# Patient Record
Sex: Female | Born: 1940 | Race: White | Hispanic: No | Marital: Married | Smoking: Never smoker
Health system: Southern US, Community
[De-identification: ages and names within clinical notes are randomized; demographics above are authoritative.]

## PROBLEM LIST (undated history)

## (undated) DIAGNOSIS — E079 Disorder of thyroid, unspecified: Secondary | ICD-10-CM

## (undated) DIAGNOSIS — I10 Essential (primary) hypertension: Secondary | ICD-10-CM

## (undated) HISTORY — PX: BLADDER SURGERY: SHX569

## (undated) HISTORY — PX: INCONTINENCE SURGERY: SHX676

## (undated) HISTORY — PX: HERNIA REPAIR: SHX51

## (undated) HISTORY — PX: ABDOMINAL HYSTERECTOMY: SHX81

---

## 2001-06-05 ENCOUNTER — Encounter: Payer: Self-pay | Admitting: Family Medicine

## 2001-06-05 ENCOUNTER — Ambulatory Visit (HOSPITAL_COMMUNITY): Admission: RE | Admit: 2001-06-05 | Discharge: 2001-06-05 | Payer: Self-pay | Admitting: Family Medicine

## 2001-06-19 ENCOUNTER — Ambulatory Visit (HOSPITAL_COMMUNITY): Admission: RE | Admit: 2001-06-19 | Discharge: 2001-06-19 | Payer: Self-pay | Admitting: Family Medicine

## 2001-06-19 ENCOUNTER — Encounter: Payer: Self-pay | Admitting: Family Medicine

## 2001-07-01 ENCOUNTER — Emergency Department (HOSPITAL_COMMUNITY): Admission: EM | Admit: 2001-07-01 | Discharge: 2001-07-01 | Payer: Self-pay | Admitting: Emergency Medicine

## 2001-08-05 ENCOUNTER — Encounter: Payer: Self-pay | Admitting: Otolaryngology

## 2001-08-05 ENCOUNTER — Ambulatory Visit (HOSPITAL_COMMUNITY): Admission: RE | Admit: 2001-08-05 | Discharge: 2001-08-05 | Payer: Self-pay | Admitting: Otolaryngology

## 2002-01-28 ENCOUNTER — Ambulatory Visit (HOSPITAL_COMMUNITY): Admission: RE | Admit: 2002-01-28 | Discharge: 2002-01-28 | Payer: Self-pay | Admitting: Family Medicine

## 2002-01-28 ENCOUNTER — Encounter: Payer: Self-pay | Admitting: Family Medicine

## 2002-06-22 ENCOUNTER — Ambulatory Visit (HOSPITAL_COMMUNITY): Admission: RE | Admit: 2002-06-22 | Discharge: 2002-06-22 | Payer: Self-pay | Admitting: Family Medicine

## 2002-06-22 ENCOUNTER — Encounter: Payer: Self-pay | Admitting: Family Medicine

## 2002-09-01 ENCOUNTER — Emergency Department (HOSPITAL_COMMUNITY): Admission: EM | Admit: 2002-09-01 | Discharge: 2002-09-01 | Payer: Self-pay | Admitting: *Deleted

## 2003-01-17 ENCOUNTER — Other Ambulatory Visit: Admission: RE | Admit: 2003-01-17 | Discharge: 2003-01-17 | Payer: Self-pay | Admitting: Obstetrics & Gynecology

## 2003-03-04 ENCOUNTER — Ambulatory Visit (HOSPITAL_COMMUNITY): Admission: RE | Admit: 2003-03-04 | Discharge: 2003-03-04 | Payer: Self-pay | Admitting: Gastroenterology

## 2003-03-04 ENCOUNTER — Encounter: Payer: Self-pay | Admitting: Gastroenterology

## 2003-03-18 ENCOUNTER — Ambulatory Visit (HOSPITAL_COMMUNITY): Admission: RE | Admit: 2003-03-18 | Discharge: 2003-03-18 | Payer: Self-pay | Admitting: Gastroenterology

## 2003-05-20 ENCOUNTER — Ambulatory Visit (HOSPITAL_COMMUNITY): Admission: RE | Admit: 2003-05-20 | Discharge: 2003-05-20 | Payer: Self-pay | Admitting: Family Medicine

## 2004-08-17 ENCOUNTER — Other Ambulatory Visit: Admission: RE | Admit: 2004-08-17 | Discharge: 2004-08-17 | Payer: Self-pay | Admitting: Obstetrics & Gynecology

## 2004-10-25 ENCOUNTER — Other Ambulatory Visit: Admission: RE | Admit: 2004-10-25 | Discharge: 2004-10-25 | Payer: Self-pay | Admitting: Obstetrics & Gynecology

## 2005-04-26 ENCOUNTER — Other Ambulatory Visit: Admission: RE | Admit: 2005-04-26 | Discharge: 2005-04-26 | Payer: Self-pay | Admitting: Obstetrics & Gynecology

## 2006-04-29 ENCOUNTER — Ambulatory Visit (HOSPITAL_COMMUNITY): Admission: RE | Admit: 2006-04-29 | Discharge: 2006-04-29 | Payer: Self-pay | Admitting: Family Medicine

## 2007-07-27 ENCOUNTER — Emergency Department (HOSPITAL_COMMUNITY): Admission: EM | Admit: 2007-07-27 | Discharge: 2007-07-27 | Payer: Self-pay | Admitting: Emergency Medicine

## 2009-10-23 ENCOUNTER — Ambulatory Visit (HOSPITAL_COMMUNITY): Admission: RE | Admit: 2009-10-23 | Discharge: 2009-10-23 | Payer: Self-pay | Admitting: Family Medicine

## 2010-03-29 ENCOUNTER — Inpatient Hospital Stay (HOSPITAL_COMMUNITY)
Admission: EM | Admit: 2010-03-29 | Discharge: 2010-04-02 | Payer: Self-pay | Source: Home / Self Care | Admitting: Emergency Medicine

## 2010-07-01 ENCOUNTER — Ambulatory Visit (HOSPITAL_COMMUNITY): Admission: RE | Admit: 2010-07-01 | Payer: Self-pay | Admitting: Obstetrics and Gynecology

## 2010-09-05 LAB — COMPREHENSIVE METABOLIC PANEL
ALT: 106 U/L — ABNORMAL HIGH (ref 0–35)
ALT: 134 U/L — ABNORMAL HIGH (ref 0–35)
ALT: 83 U/L — ABNORMAL HIGH (ref 0–35)
ALT: 90 U/L — ABNORMAL HIGH (ref 0–35)
AST: 115 U/L — ABNORMAL HIGH (ref 0–37)
AST: 53 U/L — ABNORMAL HIGH (ref 0–37)
Albumin: 2.7 g/dL — ABNORMAL LOW (ref 3.5–5.2)
Albumin: 2.9 g/dL — ABNORMAL LOW (ref 3.5–5.2)
Albumin: 3.4 g/dL — ABNORMAL LOW (ref 3.5–5.2)
Alkaline Phosphatase: 104 U/L (ref 39–117)
Alkaline Phosphatase: 74 U/L (ref 39–117)
Alkaline Phosphatase: 87 U/L (ref 39–117)
BUN: 10 mg/dL (ref 6–23)
BUN: 7 mg/dL (ref 6–23)
CO2: 20 mEq/L (ref 19–32)
Calcium: 8.6 mg/dL (ref 8.4–10.5)
Chloride: 103 mEq/L (ref 96–112)
Chloride: 92 mEq/L — ABNORMAL LOW (ref 96–112)
GFR calc Af Amer: >60 mL/min (ref >60)
GFR calc non Af Amer: >60 mL/min (ref >60)
Glucose, Bld: 109 mg/dL — ABNORMAL HIGH (ref 70–99)
Glucose, Bld: 118 mg/dL — ABNORMAL HIGH (ref 70–99)
Glucose, Bld: 122 mg/dL — ABNORMAL HIGH (ref 70–99)
Potassium: 2.5 mEq/L — CL (ref 3.5–5.1)
Potassium: 3.3 mEq/L — ABNORMAL LOW (ref 3.5–5.1)
Potassium: 3.4 mEq/L — ABNORMAL LOW (ref 3.5–5.1)
Potassium: 3.7 mEq/L (ref 3.5–5.1)
Sodium: 132 mEq/L — ABNORMAL LOW (ref 135–145)
Sodium: 132 mEq/L — ABNORMAL LOW (ref 135–145)
Sodium: 135 mEq/L (ref 135–145)
Total Bilirubin: 0.4 mg/dL (ref 0.3–1.2)
Total Protein: 6 g/dL (ref 6.0–8.3)
Total Protein: 6.4 g/dL (ref 6.0–8.3)
Total Protein: 7.3 g/dL (ref 6.0–8.3)

## 2010-09-05 LAB — CBC
HCT: 31.7 % — ABNORMAL LOW (ref 36.0–46.0)
HCT: 33 % — ABNORMAL LOW (ref 36.0–46.0)
HCT: 36.3 % (ref 36.0–46.0)
HCT: 39.1 % (ref 36.0–46.0)
Hemoglobin: 10.9 g/dL — ABNORMAL LOW (ref 12.0–15.0)
MCV: 76.5 fL — ABNORMAL LOW (ref 78.0–100.0)
MCV: 76.7 fL — ABNORMAL LOW (ref 78.0–100.0)
Platelets: 213 10*3/uL (ref 150–400)
Platelets: ADEQUATE 10*3/uL (ref 150–400)
RBC: 4.15 MIL/uL (ref 3.87–5.11)
RBC: 5.1 MIL/uL (ref 3.87–5.11)
RDW: 16.3 % — ABNORMAL HIGH (ref 11.5–15.5)
RDW: 16.7 % — ABNORMAL HIGH (ref 11.5–15.5)
RDW: 16.9 % — ABNORMAL HIGH (ref 11.5–15.5)
RDW: 17 % — ABNORMAL HIGH (ref 11.5–15.5)
WBC: 4.4 10*3/uL (ref 4.0–10.5)
WBC: 6 10*3/uL (ref 4.0–10.5)
WBC: 6.3 10*3/uL (ref 4.0–10.5)
WBC: 8.2 10*3/uL (ref 4.0–10.5)

## 2010-09-05 LAB — DIFFERENTIAL
Basophils Absolute: 0 10*3/uL (ref 0.0–0.1)
Basophils Relative: 0 % (ref 0–1)
Basophils Relative: 0 % (ref 0–1)
Basophils Relative: 0 % (ref 0–1)
Eosinophils Absolute: 0 10*3/uL (ref 0.0–0.7)
Eosinophils Absolute: 0 10*3/uL (ref 0.0–0.7)
Eosinophils Absolute: 0 10*3/uL (ref 0.0–0.7)
Eosinophils Absolute: 0.1 10*3/uL (ref 0.0–0.7)
Eosinophils Relative: 0 % (ref 0–5)
Lymphocytes Relative: 4 % — ABNORMAL LOW (ref 12–46)
Lymphs Abs: 0.2 10*3/uL — ABNORMAL LOW (ref 0.7–4.0)
Lymphs Abs: 0.3 10*3/uL — ABNORMAL LOW (ref 0.7–4.0)
Lymphs Abs: 0.5 10*3/uL — ABNORMAL LOW (ref 0.7–4.0)
Monocytes Absolute: 0.1 10*3/uL (ref 0.1–1.0)
Monocytes Absolute: 0.2 10*3/uL (ref 0.1–1.0)
Monocytes Absolute: 0.2 10*3/uL (ref 0.1–1.0)
Monocytes Absolute: 0.2 10*3/uL (ref 0.1–1.0)
Monocytes Relative: 2 % — ABNORMAL LOW (ref 3–12)
Monocytes Relative: 3 % (ref 3–12)
Monocytes Relative: 5 % (ref 3–12)
Neutrophils Relative %: 87 % — ABNORMAL HIGH (ref 43–77)

## 2010-09-05 LAB — URINALYSIS, ROUTINE W REFLEX MICROSCOPIC
Glucose, UA: NEGATIVE mg/dL
Hgb urine dipstick: NEGATIVE
Specific Gravity, Urine: 1.015 (ref 1.005–1.030)
pH: 7 (ref 5.0–8.0)

## 2010-09-05 LAB — GLUCOSE, CAPILLARY
Glucose-Capillary: 120 mg/dL — ABNORMAL HIGH (ref 70–99)
Glucose-Capillary: 83 mg/dL (ref 70–99)
Glucose-Capillary: 88 mg/dL (ref 70–99)
Glucose-Capillary: 97 mg/dL (ref 70–99)

## 2010-09-05 LAB — HEPATITIS PANEL, ACUTE
Hep A IgM: NEGATIVE
Hep B C IgM: NEGATIVE
Hepatitis B Surface Ag: NEGATIVE

## 2010-09-05 LAB — CULTURE, BLOOD (ROUTINE X 2): Culture: NO GROWTH

## 2010-09-05 LAB — BASIC METABOLIC PANEL
Chloride: 101 mEq/L (ref 96–112)
Creatinine, Ser: 0.67 mg/dL (ref 0.4–1.2)
GFR calc Af Amer: >60 mL/min (ref >60)
Potassium: 3.4 mEq/L — ABNORMAL LOW (ref 3.5–5.1)
Sodium: 133 mEq/L — ABNORMAL LOW (ref 135–145)

## 2010-09-05 LAB — STOOL CULTURE

## 2010-09-05 LAB — POCT CARDIAC MARKERS: Troponin i, poc: <0.05 ng/mL (ref 0.00–0.09)

## 2010-09-05 LAB — URINE CULTURE: Culture  Setup Time: 201110070348

## 2010-09-05 LAB — LACTIC ACID, PLASMA: Lactic Acid, Venous: 1 mmol/L (ref 0.5–2.2)

## 2010-11-21 ENCOUNTER — Other Ambulatory Visit (HOSPITAL_COMMUNITY): Payer: Self-pay | Admitting: Family Medicine

## 2010-11-21 DIAGNOSIS — Z139 Encounter for screening, unspecified: Secondary | ICD-10-CM

## 2010-11-27 ENCOUNTER — Ambulatory Visit (HOSPITAL_COMMUNITY): Payer: Medicare Other

## 2011-03-15 LAB — CBC
Hemoglobin: 14.9
MCHC: 34.3
MCV: 84.1
RBC: 5.16 — ABNORMAL HIGH
RDW: 13.5

## 2011-03-15 LAB — DIFFERENTIAL
Basophils Absolute: 0
Basophils Relative: 0
Eosinophils Absolute: 0
Monocytes Absolute: 0.4
Monocytes Relative: 3
Neutro Abs: 12.5 — ABNORMAL HIGH

## 2011-03-15 LAB — BASIC METABOLIC PANEL
CO2: 27
Calcium: 9.6
Chloride: 100
Creatinine, Ser: 0.84
Glucose, Bld: 153 — ABNORMAL HIGH
Sodium: 138

## 2011-03-15 LAB — HEPATIC FUNCTION PANEL
AST: 28
Bilirubin, Direct: 0.2
Indirect Bilirubin: 0.5
Total Bilirubin: 0.7

## 2011-03-15 LAB — LIPASE, BLOOD: Lipase: 29

## 2012-01-31 ENCOUNTER — Emergency Department (HOSPITAL_COMMUNITY)
Admission: EM | Admit: 2012-01-31 | Discharge: 2012-01-31 | Disposition: A | Discharge status: Home / Self Care | Payer: Medicare Other | Attending: Emergency Medicine | Admitting: Emergency Medicine

## 2012-01-31 ENCOUNTER — Emergency Department (HOSPITAL_COMMUNITY): Payer: Medicare Other

## 2012-01-31 ENCOUNTER — Encounter (HOSPITAL_COMMUNITY): Payer: Self-pay | Admitting: *Deleted

## 2012-01-31 DIAGNOSIS — R079 Chest pain, unspecified: Secondary | ICD-10-CM

## 2012-01-31 HISTORY — DX: Essential (primary) hypertension: I10

## 2012-01-31 LAB — CBC WITH DIFFERENTIAL/PLATELET
Basophils Absolute: 0 10*3/uL (ref 0.0–0.1)
Eosinophils Relative: 2 % (ref 0–5)
Lymphocytes Relative: 25 % (ref 12–46)
Lymphs Abs: 2.2 10*3/uL (ref 0.7–4.0)
MCV: 86.1 fL (ref 78.0–100.0)
Neutro Abs: 5.6 10*3/uL (ref 1.7–7.7)
Neutrophils Relative %: 63 % (ref 43–77)
Platelets: 327 10*3/uL (ref 150–400)
RBC: 4.61 MIL/uL (ref 3.87–5.11)
RDW: 16.3 % — ABNORMAL HIGH (ref 11.5–15.5)
WBC: 8.9 10*3/uL (ref 4.0–10.5)

## 2012-01-31 LAB — BASIC METABOLIC PANEL
CO2: 25 mEq/L (ref 19–32)
Calcium: 10.1 mg/dL (ref 8.4–10.5)
Chloride: 97 mEq/L (ref 96–112)
Glucose, Bld: 97 mg/dL (ref 70–99)
Potassium: 4.1 mEq/L (ref 3.5–5.1)
Sodium: 133 mEq/L — ABNORMAL LOW (ref 135–145)

## 2012-01-31 MED ORDER — NITROGLYCERIN 0.4 MG SL SUBL: 0.4000 mg | SUBLINGUAL_TABLET | SUBLINGUAL | Status: AC | PRN

## 2012-01-31 MED ORDER — ASPIRIN 81 MG PO CHEW
324.0000 mg | CHEWABLE_TABLET | Freq: Once | ORAL | Status: AC
  Administered 2012-01-31: 324 mg via ORAL
  Filled 2012-01-31: qty 4

## 2012-01-31 NOTE — ED Provider Notes (Signed)
History   This chart was scribed for Jennifer Hutching, MD by Jennifer Sosa . The patient was seen in room APA07/APA07. Patient's care was started at 0930.    CSN: [id number]  Arrival date & time 01/31/12  0917   First MD Initiated Contact with Patient 01/31/12 0930      Chief Complaint  Patient presents with  . Chest Pain    (Consider location/radiation/quality/duration/timing/severity/associated sxs/prior treatment) HPI Jennifer Sosa is a 71 y.o. female who presents to the Emergency Department complaining of intermittent, moderate, centralized chest pain for the past week. Pt describes her chest pain as tightness. Pt states that she has had x3 episodes of chest tightness this week, with the worse being yesterday that lasted for 10-15 minutes. Pt states that yesterday's episode occurred after minimal exertion and the chest pain radiated to bilateral chest, bilateral arms and to her right jaw. Pt denies any radiation into her back. Pt also reports associated dizziness. Pt denies any SOB, diaphoresis and nausea. Pt states that she is not having any chest pain currently, but states that she feels weak. Pt reports a h/o HTN and diabetes. Pt denies smoking. Pt denies any h/o heart problems. Pt states that she went to Dr. Michelle Sosa office this morning and was told to come to ED for evaluation.     Past Medical History  Diagnosis Date  . Diabetes mellitus   . Hypertension     Past Surgical History  Procedure Date  . Abdominal hysterectomy   . Bladder surgery   . Incontinence surgery     History reviewed. No pertinent family history.  History  Substance Use Topics  . Smoking status: Never Smoker   . Smokeless tobacco: Not on file  . Alcohol Use: No    OB History    Grav Para Term Preterm Abortions TAB SAB Ect Mult Living                  Review of Systems A complete 10 system review of systems was obtained and all systems are negative except as noted in the HPI and PMH.    Allergies  Review of patient's allergies indicates no known allergies.  Home Medications   Current Outpatient Rx  Name Route Sig Dispense Refill  . LISINOPRIL-HYDROCHLOROTHIAZIDE 20-25 MG PO TABS Oral Take 1 tablet by mouth every morning.    Marland Kitchen METFORMIN HCL ER 500 MG PO TB24 Oral Take 500 mg by mouth 2 (two) times daily.     . ADULT MULTIVITAMIN W/MINERALS CH Oral Take 1 tablet by mouth every morning.    Marland Kitchen OMEPRAZOLE 20 MG PO CPDR Oral Take 20 mg by mouth daily as needed. For acid reflux    . POLYETHYL GLYCOL-PROPYL GLYCOL 0.4-0.3 % OP GEL Both Eyes Place 1 drop into both eyes 2 (two) times daily as needed. For dry eyes    . TRIAMTERENE-HCTZ 37.5-25 MG PO CAPS Oral Take 1 capsule by mouth daily as needed. For high blood pressure    . VERAPAMIL HCL ER 240 MG PO TBCR Oral Take 240 mg by mouth at bedtime.      BP 144/57  Temp 98.1 F (36.7 C) (Oral)  Resp 16  Ht 4\' 11"  (1.499 m)  Wt 115 lb (52.164 kg)  BMI 23.23 kg/m2  SpO2 100%  Physical Exam  Nursing note and vitals reviewed. Constitutional: She is oriented to person, place, and time. She appears well-developed and well-nourished. No distress.  HENT:  Head: Normocephalic and  atraumatic.  Eyes: EOM are normal. Pupils are equal, round, and reactive to light.  Neck: Normal range of motion. Neck supple. No tracheal deviation present.  Cardiovascular: Normal rate, regular rhythm and normal heart sounds.   Pulmonary/Chest: Effort normal and breath sounds normal. No respiratory distress. She has no wheezes.  Abdominal: Soft. She exhibits no distension.  Musculoskeletal: Normal range of motion. She exhibits no edema.  Neurological: She is alert and oriented to person, place, and time. No sensory deficit.  Skin: Skin is warm and dry.  Psychiatric: She has a normal mood and affect. Her behavior is normal.    ED Course  Procedures (including critical care time)  DIAGNOSTIC STUDIES: Oxygen Saturation is 100% on room air, normal by  my interpretation.    COORDINATION OF CARE:  09:39-Discussed planned course of treatment with the patient including checking cardiac enzymes, blood work, EKG and chest x-ray who is agreeable at this time.   10:00-Medication Orders: Aspirin chewable tablet 324 mg-once.   10:51-Consultation with Dr. Sudie Sosa. Discussed pt's case. Pt will be started on aspirin, d/c and will f/u in Dr. Michelle Sosa office in 4 days.   11:04-Recheck: Informed pt of consult with Dr. Sudie Sosa and plan. Pt is agreeable.   Results for orders placed during the hospital encounter of 01/31/12  CBC WITH DIFFERENTIAL      Component Value Range   WBC 8.9  4.0 - 10.5 K/uL   RBC 4.61  3.87 - 5.11 MIL/uL   Hemoglobin 13.2  12.0 - 15.0 g/dL   HCT 16.1  09.6 - 04.5 %   MCV 86.1  78.0 - 100.0 fL   MCH 28.6  26.0 - 34.0 pg   MCHC 33.2  30.0 - 36.0 g/dL   RDW 40.9 (*) 81.1 - 91.4 %   Platelets 327  150 - 400 K/uL   Neutrophils Relative 63  43 - 77 %   Neutro Abs 5.6  1.7 - 7.7 K/uL   Lymphocytes Relative 25  12 - 46 %   Lymphs Abs 2.2  0.7 - 4.0 K/uL   Monocytes Relative 10  3 - 12 %   Monocytes Absolute 0.9  0.1 - 1.0 K/uL   Eosinophils Relative 2  0 - 5 %   Eosinophils Absolute 0.2  0.0 - 0.7 K/uL   Basophils Relative 1  0 - 1 %   Basophils Absolute 0.0  0.0 - 0.1 K/uL  BASIC METABOLIC PANEL      Component Value Range   Sodium 133 (*) 135 - 145 mEq/L   Potassium 4.1  3.5 - 5.1 mEq/L   Chloride 97  96 - 112 mEq/L   CO2 25  19 - 32 mEq/L   Glucose, Bld 97  70 - 99 mg/dL   BUN 11  6 - 23 mg/dL   Creatinine, Ser 7.82  0.50 - 1.10 mg/dL   Calcium 95.6  8.4 - 21.3 mg/dL   GFR calc non Af Amer 69 (*) >90 mL/min   GFR calc Af Amer 80 (*) >90 mL/min  TROPONIN I      Component Value Range   Troponin I <0.30  <0.30 ng/mL    Dg Chest 2 View  01/31/2012  *RADIOLOGY REPORT*  Clinical Data: Chest pain.  Chest tightness for 2 weeks.  CHEST - 2 VIEW  Comparison: 04/01/2010.  Findings:  Cardiopericardial silhouette within  normal limits. Mediastinal contours normal. Trachea midline.  No airspace disease or effusion.  Moderate hiatal hernia noted. Monitoring leads  are projected over the chest.  IMPRESSION: No active cardiopulmonary disease.  Original Report Authenticated By: Andreas Newport, M.D.     No diagnosis found.   Date: 01/31/2012  Rate: 71  Rhythm: normal sinus rhythm  QRS Axis: normal  Intervals: normal  ST/T Wave abnormalities: normal  Conduction Disutrbances: none  Narrative Interpretation: unremarkable      MDM  Patient's clinically and hemodynamically stable. No pain at present.  Discussed with Dr. Sudie Sosa.  He will see patient as an outpatient. Prescription for nitroglycerin given. Take aspirin daily. Return if worse.      I personally performed the services described in this documentation, which was scribed in my presence. The recorded information has been reviewed and considered.    Jennifer Hutching, MD 01/31/12 1157

## 2012-01-31 NOTE — ED Notes (Signed)
Pt c/o chest tightness in the center of her chest off and on x 1 week. Pt states that the worst pain was yesterday and that it radiated to both sides of her chest, bilateral arms and up to her right jaw. Pt also c/o dizziness. Pt states that she just feels weak today but has had no pain. Pt went to Dr. Michelle Nasuti office and was told to come to ED for evaluation.

## 2012-01-31 NOTE — ED Notes (Signed)
Pt still showing NSR on cardiac monitor. No c/o pain or tightness at this time. Family at bedside.

## 2012-11-27 ENCOUNTER — Telehealth: Payer: Self-pay

## 2012-11-27 NOTE — Telephone Encounter (Signed)
Pt was referred by Dr. Knowlton for screening colonoscopy. LMOM for a return call.  

## 2012-12-01 NOTE — Telephone Encounter (Signed)
    Ferne Reus      Sent: Mon November 30, 2012  8:33 AM    To: Trudee Kuster, LPN     Due: Mon November 30, 2012  8:36 AM       KEALOHILANI MAIORINO    MRN: [id number] DOB: 11/20/1940     Pt Work: [phone] Pt Home: [phone]           Message    Pt called to let you know that she wants to wait before scheduling tcs due to her being sick. She will call back to get scheduled.

## 2012-12-18 ENCOUNTER — Telehealth: Payer: Self-pay | Admitting: Cardiovascular Disease

## 2012-12-18 NOTE — Telephone Encounter (Signed)
Pt took bp this am  116/62 and pulse was 69  Did not take any BP med.  Blood sugar was ok and only took 1/2 of her metformin.  Now has swelling in feet and ankles ( is seen in [place])

## 2012-12-18 NOTE — Telephone Encounter (Signed)
Returned call.  Pt stated she got up this morning and her BP was 116/66 HR 69.  Stated a couple of hours afterwards she started having fluid around her ankles.  Stated she took a fluid pill he (Dr. Alanda Amass) had her on and it has gotten better.  Stated she takes her Calan at night.  Pt wanted to know what else she needed to do if anything.  Pt informed BP and HR are WNL and advised she should have taken her meds as directed, especially since the lisinopril has HCTZ in it which would have helped w/ fluid in ankles.  Pt advised to take all meds as directed and to hold BP meds only if BP is 100/60 or less and she should call the office.  Pt verbalized understanding and agreed w/ plan.

## 2013-01-15 ENCOUNTER — Other Ambulatory Visit: Payer: Self-pay | Admitting: Cardiovascular Disease

## 2013-01-15 LAB — COMPREHENSIVE METABOLIC PANEL
Albumin: 4 g/dL (ref 3.5–5.2)
Alkaline Phosphatase: 73 U/L (ref 39–117)
BUN: 14 mg/dL (ref 6–23)
Glucose, Bld: 94 mg/dL (ref 70–99)
Potassium: 4.3 mEq/L (ref 3.5–5.3)

## 2013-01-15 LAB — LIPID PANEL
Cholesterol: 161 mg/dL (ref 0–200)
HDL: 40 mg/dL (ref >39)
LDL Cholesterol: 91 mg/dL (ref 0–99)
Triglycerides: 151 mg/dL — ABNORMAL HIGH (ref <150)

## 2013-01-15 LAB — CBC WITH DIFFERENTIAL/PLATELET
Basophils Relative: 1 % (ref 0–1)
Eosinophils Absolute: 0.3 10*3/uL (ref 0.0–0.7)
HCT: 36.4 % (ref 36.0–46.0)
Hemoglobin: 11.7 g/dL — ABNORMAL LOW (ref 12.0–15.0)
MCH: 25.8 pg — ABNORMAL LOW (ref 26.0–34.0)
MCHC: 32.1 g/dL (ref 30.0–36.0)
Monocytes Absolute: 0.9 10*3/uL (ref 0.1–1.0)
Monocytes Relative: 12 % (ref 3–12)
Neutro Abs: 4.3 10*3/uL (ref 1.7–7.7)

## 2013-01-21 ENCOUNTER — Encounter: Payer: Self-pay | Admitting: Cardiovascular Disease

## 2013-04-06 ENCOUNTER — Ambulatory Visit: Payer: Medicare Other | Admitting: Cardiovascular Disease

## 2013-05-03 ENCOUNTER — Other Ambulatory Visit: Payer: Self-pay | Admitting: *Deleted

## 2013-05-03 MED ORDER — PANTOPRAZOLE SODIUM 40 MG PO TBEC: 40.0000 mg | DELAYED_RELEASE_TABLET | Freq: Every day | ORAL | Status: DC

## 2013-05-03 NOTE — Telephone Encounter (Signed)
pantoprazole 40mg  refilled electronically.  Changed form protonix  01/15/13.

## 2013-05-05 ENCOUNTER — Other Ambulatory Visit: Payer: Self-pay | Admitting: *Deleted

## 2013-05-05 MED ORDER — PANTOPRAZOLE SODIUM 40 MG PO TBEC: 40.0000 mg | DELAYED_RELEASE_TABLET | Freq: Every day | ORAL | Status: DC

## 2013-08-20 ENCOUNTER — Telehealth: Payer: Self-pay | Admitting: *Deleted

## 2013-08-20 MED ORDER — LISINOPRIL-HYDROCHLOROTHIAZIDE 20-25 MG PO TABS: 1.0000 | ORAL_TABLET | Freq: Every morning | ORAL | Status: AC

## 2013-08-20 NOTE — Telephone Encounter (Signed)
Faxed rx from [facility] f [phone] p [phone]  Lisinopril 20-25 mg tab #90

## 2013-08-20 NOTE — Telephone Encounter (Signed)
Medication sent via escribe.  

## 2013-08-30 ENCOUNTER — Telehealth: Payer: Self-pay | Admitting: *Deleted

## 2013-08-31 NOTE — Telephone Encounter (Signed)
error 

## 2013-09-03 ENCOUNTER — Ambulatory Visit: Payer: Medicare Other | Admitting: Cardiovascular Disease

## 2013-12-08 ENCOUNTER — Other Ambulatory Visit: Payer: Self-pay | Admitting: *Deleted

## 2013-12-08 MED ORDER — PANTOPRAZOLE SODIUM 40 MG PO TBEC: 40.0000 mg | DELAYED_RELEASE_TABLET | Freq: Every day | ORAL | Status: AC

## 2013-12-08 NOTE — Telephone Encounter (Signed)
pantoprazole 40mg  refilled w/no refills - needs appt.

## 2013-12-20 ENCOUNTER — Emergency Department (HOSPITAL_COMMUNITY): Payer: Medicare Other

## 2013-12-20 ENCOUNTER — Emergency Department (HOSPITAL_COMMUNITY)
Admission: EM | Admit: 2013-12-20 | Discharge: 2013-12-20 | Disposition: A | Discharge status: Home / Self Care | Payer: Medicare Other | Attending: Emergency Medicine | Admitting: Emergency Medicine

## 2013-12-20 ENCOUNTER — Encounter (HOSPITAL_COMMUNITY): Payer: Self-pay | Admitting: Emergency Medicine

## 2013-12-20 DIAGNOSIS — Y9389 Activity, other specified: Secondary | ICD-10-CM | POA: Insufficient documentation

## 2013-12-20 DIAGNOSIS — Z79899 Other long term (current) drug therapy: Secondary | ICD-10-CM | POA: Insufficient documentation

## 2013-12-20 DIAGNOSIS — E119 Type 2 diabetes mellitus without complications: Secondary | ICD-10-CM | POA: Insufficient documentation

## 2013-12-20 DIAGNOSIS — W010XXA Fall on same level from slipping, tripping and stumbling without subsequent striking against object, initial encounter: Secondary | ICD-10-CM | POA: Insufficient documentation

## 2013-12-20 DIAGNOSIS — S42292A Other displaced fracture of upper end of left humerus, initial encounter for closed fracture: Secondary | ICD-10-CM

## 2013-12-20 DIAGNOSIS — Y929 Unspecified place or not applicable: Secondary | ICD-10-CM | POA: Insufficient documentation

## 2013-12-20 DIAGNOSIS — E079 Disorder of thyroid, unspecified: Secondary | ICD-10-CM | POA: Insufficient documentation

## 2013-12-20 DIAGNOSIS — S42293A Other displaced fracture of upper end of unspecified humerus, initial encounter for closed fracture: Secondary | ICD-10-CM | POA: Insufficient documentation

## 2013-12-20 DIAGNOSIS — I10 Essential (primary) hypertension: Secondary | ICD-10-CM | POA: Insufficient documentation

## 2013-12-20 HISTORY — DX: Disorder of thyroid, unspecified: E07.9

## 2013-12-20 MED ORDER — OXYCODONE HCL 5 MG PO TABS: 5.0000 mg | ORAL_TABLET | Freq: Four times a day (QID) | ORAL | Status: AC | PRN

## 2013-12-20 MED ORDER — OXYCODONE HCL 5 MG PO TABS
5.0000 mg | ORAL_TABLET | Freq: Once | ORAL | Status: AC
  Administered 2013-12-20: 5 mg via ORAL
  Filled 2013-12-20: qty 1

## 2013-12-20 NOTE — ED Notes (Signed)
Pain lt shoulder after fall, slipped on piece of plastic

## 2013-12-20 NOTE — ED Provider Notes (Signed)
CSN: [id number]     Arrival date & time 12/20/13  1127 History   First MD Initiated Contact with Patient 12/20/13 1329 This chart was scribed for non-physician practitioner Kathie Dike, PA-C working with Vanetta Mulders, MD by Valera Castle, ED scribe. This patient was seen in room APFT23/APFT23 and the patient's care was started at 2:15 PM.     Chief Complaint  Patient presents with  . Fall    (Consider location/radiation/quality/duration/timing/severity/associated sxs/prior Treatment) Patient is a 73 y.o. female presenting with fall and shoulder injury. The history is provided by the patient. No language interpreter was used.  Fall This is a new problem. The current episode started 3 to 5 hours ago. The problem occurs constantly. The problem has been gradually worsening. Pertinent negatives include no chest pain, no abdominal pain, no headaches and no shortness of breath. Associated symptoms comments: + for left shoulder pain. Exacerbated by: movement. Nothing relieves the symptoms. She has tried nothing for the symptoms.  Shoulder Injury This is a new problem. The current episode started 3 to 5 hours ago. The problem occurs constantly. The problem has been gradually worsening. Pertinent negatives include no chest pain, no abdominal pain, no headaches and no shortness of breath. Exacerbated by: movement. Nothing relieves the symptoms.   HPI Comments: Jennifer Sosa is a 73 y.o. female who presents to the Emergency Department complaining of constant, left shoulder pain, onset shortly PTA after she slipped on a piece of plastic and landed on her shoulder trying to avoid landing on her hip. She states she felt her bone crack upon impact. She reports associated gradually worsening muscle pain around the area of impact. She denies open wounds, head trauma, abdominal pain, back pain, neck pain, and any other associated symptoms. She denies taking Coumadin.   PCP - Milana Obey, MD  Past  Medical History  Diagnosis Date  . Diabetes mellitus   . Hypertension   . Thyroid disease    Past Surgical History  Procedure Laterality Date  . Abdominal hysterectomy    . Bladder surgery    . Incontinence surgery     History reviewed. No pertinent family history. History  Substance Use Topics  . Smoking status: Never Smoker   . Smokeless tobacco: Not on file  . Alcohol Use: No   OB History   Grav Para Term Preterm Abortions TAB SAB Ect Mult Living                 Review of Systems  Constitutional: Negative for activity change.       All ROS Neg except as noted in HPI  HENT: Negative for nosebleeds.   Eyes: Negative for photophobia and discharge.  Respiratory: Negative for cough, shortness of breath and wheezing.   Cardiovascular: Negative for chest pain and palpitations.  Gastrointestinal: Negative for abdominal pain and blood in stool.  Genitourinary: Negative for dysuria, frequency and hematuria.  Musculoskeletal: Positive for arthralgias (left shoulder), joint swelling and myalgias (around left shoulder). Negative for back pain, neck pain and neck stiffness.  Skin: Negative.  Negative for wound.  Neurological: Negative for dizziness, seizures, syncope, speech difficulty and headaches.  Psychiatric/Behavioral: Negative for hallucinations and confusion.  All other systems reviewed and are negative.   Allergies  Review of patient's allergies indicates no known allergies.  Home Medications   Prior to Admission medications   Medication Sig Start Date End Date Taking? Authorizing Kavin Weckwerth  acetaminophen (TYLENOL) 500 MG tablet Take 1,500 mg by mouth  every 6 (six) hours as needed for moderate pain.   Yes Historical Amber Guthridge, MD  levothyroxine (SYNTHROID, LEVOTHROID) 25 MCG tablet Take 25 mcg by mouth daily before breakfast.   Yes Historical Cheryln Balcom, MD  lisinopril-hydrochlorothiazide (PRINZIDE,ZESTORETIC) 20-25 MG per tablet Take 1 tablet by mouth every morning.  08/20/13  Yes Laqueta LindenSuresh A Koneswaran, MD  metFORMIN (GLUCOPHAGE-XR) 500 MG 24 hr tablet Take 500 mg by mouth 2 (two) times daily as needed (high blood sugars).    Yes Historical Dvora Buitron, MD  pantoprazole (PROTONIX) 40 MG tablet Take 1 tablet (40 mg total) by mouth daily. 12/08/13  Yes Runell GessJonathan J Berry, MD  Polyethyl Glycol-Propyl Glycol (SYSTANE) 0.4-0.3 % GEL Place 1 drop into both eyes 2 (two) times daily as needed. For dry eyes   Yes Historical Estefanie Cornforth, MD  verapamil (CALAN-SR) 240 MG CR tablet Take 240 mg by mouth at bedtime.   Yes Historical Itai Barbian, MD  vitamin B-12 (CYANOCOBALAMIN) 1000 MCG tablet Take 1,000 mcg by mouth daily.   Yes Historical Maxx Pham, MD  nitroGLYCERIN (NITROSTAT) 0.4 MG SL tablet Place 1 tablet (0.4 mg total) under the tongue every 5 (five) minutes as needed for chest pain. 01/31/12 01/30/13  Donnetta HutchingBrian Cook, MD   BP 147/69  Pulse 80  Temp(Src) 98.3 F (36.8 C) (Oral)  Resp 18  Ht 5' (1.524 m)  Wt 115 lb (52.164 kg)  BMI 22.46 kg/m2  SpO2 100% Physical Exam  Nursing note and vitals reviewed. Constitutional: She is oriented to person, place, and time. She appears well-developed and well-nourished. No distress.  HENT:  Head: Normocephalic and atraumatic.  Eyes: Conjunctivae and EOM are normal.  Neck: Neck supple. No tracheal deviation present.  Cardiovascular: Normal rate, regular rhythm, normal heart sounds and intact distal pulses.   No murmur heard. Cap refill < 2 seconds.  Pulmonary/Chest: Effort normal and breath sounds normal. No respiratory distress. She exhibits no tenderness.  Abdominal: There is no tenderness.  Musculoskeletal: She exhibits tenderness.       Left shoulder: She exhibits decreased range of motion, tenderness, bony tenderness and deformity. She exhibits no laceration and normal pulse.       Left elbow: Normal. She exhibits no deformity.       Left wrist: Normal. She exhibits no deformity.  Deformity of left shoulder. No deformity of the  clavicle. No displacement of scapula. No effusion or deformity of elbow. No deformity of forearm or wrist. Degenerative changes of fingers.   Neurological: She is alert and oriented to person, place, and time.  Skin: Skin is warm and dry.  Psychiatric: She has a normal mood and affect. Her behavior is normal.    ED Course  Procedures (including critical care time)  DIAGNOSTIC STUDIES: Oxygen Saturation is 100% on room air, normal by my interpretation.    COORDINATION OF CARE: 2:22 PM-Discussed treatment plan with pt at bedside and pt agreed to plan.   Medications  oxyCODONE (Oxy IR/ROXICODONE) immediate release tablet 5 mg (not administered)   Dg Shoulder Left  12/20/2013   CLINICAL DATA:  Left shoulder pain status post fall  EXAM: LEFT SHOULDER - 2+ VIEW  COMPARISON:  None.  FINDINGS: There is a mildly impacted fracture of the neck of the left humerus. There is also an acute avulsion of the greater tuberosity of the left humerus. The bony glenoid is intact. The acromion and distal clavicle also are normal in appearance. The observed portions of the upper left ribs are normal.  IMPRESSION: The patient has  sustained an acute subcapital fracture of the left humeral head as well as an avulsion of the greater tuberosity   Electronically Signed   By: David  [place]   On: 12/20/2013 13:02     EKG Interpretation None      MDM The x-ray reveals a subcapital fracture of the left humeral head. Patient seen with me by Dr. Deretha EmoryZackowski . I have discussed the x-ray findings with the patient in terms which he understands. The patient has been fitted with a shoulder immobilizer and provided with an ice pack. A prescription for Roxicodone 5 mg has been given to the patient with instructions that this may cause drowsiness, lightheadedness, and/or constipation. Patient ate knowledge is the discharge instructions, and will follow up with Dr. Romeo AppleHarrison in the coming week.    Final diagnoses:  None    *I  have reviewed nursing notes, vital signs, and all appropriate lab and imaging results for this patient.**   **I personally performed the services described in this documentation, which was scribed in my presence. The recorded information has been reviewed and is accurate.Kathie Dike*   Hobson M Bryant, PA-C 12/20/13 1437

## 2013-12-20 NOTE — ED Notes (Signed)
Pt verbalized understanding to use caution with pain med, pt states that she doesn't drive

## 2013-12-20 NOTE — ED Provider Notes (Signed)
Medical screening examination/treatment/procedure(s) were conducted as a shared visit with non-physician practitioner(s) and myself.  I personally evaluated the patient during the encounter.   EKG Interpretation None      Very nice elderly lady status post a fall slipped no loss of consciousness landed on her left arm and shoulder and heard a crack. Patient did not hurt anything else. Not hit her head no loss of consciousness. Patient has seen Dr. Aline Brochure from orthopedics in the past. X-rays to pick the very proximal humeral head fracture will need to a sling-and-swathe and followup with orthopedics. Patient's radial pulses 2+ in the left hand sensations intact good range of motion of the fingers. No significant swelling.  Results for orders placed in visit on 01/15/13  CBC WITH DIFFERENTIAL      Result Value Ref Range   WBC 7.5  4.0 - 10.5 K/uL   RBC 4.53  3.87 - 5.11 MIL/uL   Hemoglobin 11.7 (*) 12.0 - 15.0 g/dL   HCT 36.4  36.0 - 46.0 %   MCV 80.4  78.0 - 100.0 fL   MCH 25.8 (*) 26.0 - 34.0 pg   MCHC 32.1  30.0 - 36.0 g/dL   RDW 15.0  11.5 - 15.5 %   Platelets 219  150 - 400 K/uL   Neutrophils Relative % 58  43 - 77 %   Neutro Abs 4.3  1.7 - 7.7 K/uL   Lymphocytes Relative 25  12 - 46 %   Lymphs Abs 1.9  0.7 - 4.0 K/uL   Monocytes Relative 12  3 - 12 %   Monocytes Absolute 0.9  0.1 - 1.0 K/uL   Eosinophils Relative 4  0 - 5 %   Eosinophils Absolute 0.3  0.0 - 0.7 K/uL   Basophils Relative 1  0 - 1 %   Basophils Absolute 0.1  0.0 - 0.1 K/uL   Smear Review Criteria for review not met    COMPREHENSIVE METABOLIC PANEL      Result Value Ref Range   Sodium 136  135 - 145 mEq/L   Potassium 4.3  3.5 - 5.3 mEq/L   Chloride 99  96 - 112 mEq/L   CO2 28  19 - 32 mEq/L   Glucose, Bld 94  70 - 99 mg/dL   BUN 14  6 - 23 mg/dL   Creat 0.81  0.50 - 1.10 mg/dL   Total Bilirubin 0.3  0.3 - 1.2 mg/dL   Alkaline Phosphatase 73  39 - 117 U/L   AST 15  0 - 37 U/L   ALT 14  0 - 35 U/L   Total Protein 6.6  6.0 - 8.3 g/dL   Albumin 4.0  3.5 - 5.2 g/dL   Calcium 9.8  8.4 - 10.5 mg/dL  LIPID PANEL      Result Value Ref Range   Cholesterol 161  0 - 200 mg/dL   Triglycerides 151 (*) <150 mg/dL   HDL 40  >39 mg/dL   Total CHOL/HDL Ratio 4.0     VLDL 30  0 - 40 mg/dL   LDL Cholesterol 91  0 - 99 mg/dL  TSH      Result Value Ref Range   TSH 2.321  0.350 - 4.500 uIU/mL  HEMOGLOBIN A1C      Result Value Ref Range   Hemoglobin A1C 5.7 (*) <5.7 %   Mean Plasma Glucose 117 (*) <117 mg/dL   Dg Shoulder Left  12/20/2013   CLINICAL DATA:  Left shoulder pain status post fall  EXAM: LEFT SHOULDER - 2+ VIEW  COMPARISON:  None.  FINDINGS: There is a mildly impacted fracture of the neck of the left humerus. There is also an acute avulsion of the greater tuberosity of the left humerus. The bony glenoid is intact. The acromion and distal clavicle also are normal in appearance. The observed portions of the upper left ribs are normal.  IMPRESSION: The patient has sustained an acute subcapital fracture of the left humeral head as well as an avulsion of the greater tuberosity   Electronically Signed   By: David  [place]   On: 12/20/2013 13:02      Fredia Sorrow, MD 12/20/13 [phone]

## 2013-12-20 NOTE — Discharge Instructions (Signed)
Your x-ray shows a fracture of your left humerus bone (shoulder area). Please use the shoulder immobilizer until seen by Dr. Romeo AppleHarrison. Please apply ice for 10-15 minute intervals. May use Tylenol or ibuprofen for mild pain, use Roxicodone for more severe pain. Roxicodone may cause lightheadedness, drowsiness, and/or constipation. Please use this medication with caution. Humerus Fracture, Treated with Immobilization The humerus is the large bone in the upper arm. A broken (fractured) humerus is often treated by wearing a cast, splint, or sling (immobilization). This holds the broken pieces in place so they can heal.  HOME CARE  Put ice on the injured area.  Put ice in a plastic bag.  Place a towel between your skin and the bag.  Leave the ice on for 15-20 minutes, 03-04 times a day.  If you are given a cast:  Do not scratch the skin under the cast.  Check the skin around the cast every day. You may put lotion on any red or sore areas.  Keep the cast dry and clean.  If you are given a splint:  Wear the splint as told.  Keep the splint clean and dry.  Loosen the elastic around the splint if your fingers become numb, cold, tingle, or turn blue.  If you are given a sling:  Wear the sling as told.  Do not put pressure on any part of the cast or splint until it is fully hardened.  The cast or splint must be protected with a plastic bag during bathing. Do not lower the cast or splint into water.  Only take medicine as told by your doctor.  Do exercises as told by your doctor.  Follow up as told by your doctor. GET HELP RIGHT AWAY IF:   Your skin or fingernails turn blue or gray.  Your arm feels cold or numb.  You have very bad pain in the injured arm.  You are having problems with the medicines you were given. MAKE SURE YOU:   Understand these instructions.  Will watch your condition.  Will get help right away if you are not doing well or get worse. Document  Released: 11/27/2007 Document Revised: 09/02/2011 Document Reviewed: 07/25/2010 [facility] Patient Information 2015 [employer] [place]. This information is not intended to replace advice given to you by your health care provider. Make sure you discuss any questions you have with your health care provider.

## 2013-12-21 ENCOUNTER — Ambulatory Visit (INDEPENDENT_AMBULATORY_CARE_PROVIDER_SITE_OTHER): Payer: Medicare Other | Admitting: Orthopedic Surgery

## 2013-12-21 VITALS — BP 129/69 | Ht 60.0 in | Wt 115.0 lb

## 2013-12-21 DIAGNOSIS — S42209A Unspecified fracture of upper end of unspecified humerus, initial encounter for closed fracture: Secondary | ICD-10-CM

## 2013-12-21 DIAGNOSIS — S42202A Unspecified fracture of upper end of left humerus, initial encounter for closed fracture: Secondary | ICD-10-CM

## 2013-12-21 MED ORDER — HYDROCODONE-ACETAMINOPHEN 7.5-325 MG PO TABS: 1.0000 | ORAL_TABLET | ORAL | Status: AC | PRN

## 2013-12-21 NOTE — Patient Instructions (Signed)
Wear sling for 3 weeks

## 2013-12-21 NOTE — Progress Notes (Signed)
Patient ID: Jennifer RayDianna J Sosa, female   DOB: 08-Nov-1940, 73 y.o.   MRN: [id number]  Chief Complaint  Patient presents with  . Shoulder Pain    Fractured left humerus d/t injury 12/20/13   BP 129/69  Ht 5' (1.524 m)  Wt 115 lb (52.164 kg)  BMI 22.46 kg/m2  HISTORY: 73 year-old female fell yesterday on June 29 injured her left shoulder. Emergency room x-rays showed a proximal humerus fracture. She complains of pain with mild burning and stabbing over the left shoulder. She took some oxycodone 5 mg dose it was kind of strong she cut it in half and did better. Review of systems sinus problems hearing issues swelling of the legs heartburn nausea apparently related to the medication joint and limb pain also related to the fracture she wears glasses history of falling balance problems numbness tingling diabetes thyroid disorder otherwise systems were reviewed complete normal peer  The past, family history and social history have been reviewed and are recorded in the corresponding sections of epic   Vital signs: BP 129/69  Ht 5' (1.524 m)  Wt 115 lb (52.164 kg)  BMI 22.46 kg/m2   General the patient is well-developed and well-nourished grooming and hygiene are normal Oriented x3 Mood and affect normal Ambulation normal without support  Inspection of the left proximal humerus minimal swelling with moderate tenderness. We cannot assess range of motion or joint stability because of the fracture and pain Motor exam revealed normal muscle tone Skin clean dry and intact  Cardiovascular exam is normal Sensory exam normal  I interpret the x-rays as a proximal humerus fracture with fracture lines involving the greater tuberosity and surgical neck all meeting nondisplaced criteria  Shoulder immobilizer for 3 weeks and x-Sosa if okay at that time start therapy

## 2014-01-10 ENCOUNTER — Ambulatory Visit (INDEPENDENT_AMBULATORY_CARE_PROVIDER_SITE_OTHER): Payer: Medicare Other | Admitting: Orthopedic Surgery

## 2014-01-10 ENCOUNTER — Ambulatory Visit (INDEPENDENT_AMBULATORY_CARE_PROVIDER_SITE_OTHER): Payer: Medicare Other

## 2014-01-10 VITALS — BP 141/75 | Ht 60.0 in | Wt 115.0 lb

## 2014-01-10 DIAGNOSIS — S42309D Unspecified fracture of shaft of humerus, unspecified arm, subsequent encounter for fracture with routine healing: Secondary | ICD-10-CM

## 2014-01-10 DIAGNOSIS — S42202D Unspecified fracture of upper end of left humerus, subsequent encounter for fracture with routine healing: Secondary | ICD-10-CM

## 2014-01-10 DIAGNOSIS — S42209A Unspecified fracture of upper end of unspecified humerus, initial encounter for closed fracture: Secondary | ICD-10-CM

## 2014-01-10 NOTE — Progress Notes (Signed)
Patient ID: [name], female   DOB: 12/26/1940, 73 y.o.   MRN: [id number]  Chief Complaint  Patient presents with  . Follow-up    3 week recheck on left shoulder fracture with xray. DOI 12-20-13.   BP 141/75  Ht 5' (1.524 m)  Wt 115 lb (52.164 kg)  BMI 22.46 kg/m2 Encounter Diagnoses  Name Primary?  . Fracture of proximal end of left humerus, with routine healing, subsequent encounter Yes  . Closed fracture of unspecified part of upper end of humerus     Recheck left shoulder x-ray shows fracture stable with good healing. Recommend continue sling for comfort. Order therapy followup 6 weeks

## 2014-01-10 NOTE — Patient Instructions (Addendum)
Call to arrange therapy @ [facility] Wear sling as needed for comfort

## 2014-01-12 ENCOUNTER — Telehealth: Payer: Self-pay | Admitting: *Deleted

## 2014-01-12 NOTE — Telephone Encounter (Signed)
PATIENT CALLING TO ASK IF PT 3 X WEEKLY IS NECESSARY BECAUSE SHE IS BEING CHARGED $40 CO PAY EACH VISIT. SHE IS SCHEDULED IN [place]. SHE STATES SHE WANTS TO BE COMPLIANT BUT SHE CANNOT AFFORD $120 A WEEK FOR THERAPY.

## 2014-01-13 NOTE — Telephone Encounter (Signed)
DO IT AT HOME THERAPIST WILL GIVE INSTRUCTIONS

## 2014-01-14 NOTE — Telephone Encounter (Signed)
CALLED PATIENT, LEFT VOICEMAIL TO RETURN CALL

## 2014-01-14 NOTE — Telephone Encounter (Signed)
PATIENT ADVISED

## 2014-02-14 ENCOUNTER — Other Ambulatory Visit (HOSPITAL_COMMUNITY): Payer: Self-pay | Admitting: Family Medicine

## 2014-02-14 DIAGNOSIS — M899 Disorder of bone, unspecified: Secondary | ICD-10-CM

## 2014-02-14 DIAGNOSIS — M949 Disorder of cartilage, unspecified: Principal | ICD-10-CM

## 2014-02-17 ENCOUNTER — Other Ambulatory Visit (HOSPITAL_COMMUNITY): Payer: Medicare Other

## 2014-02-22 ENCOUNTER — Ambulatory Visit (INDEPENDENT_AMBULATORY_CARE_PROVIDER_SITE_OTHER): Payer: Self-pay | Admitting: Orthopedic Surgery

## 2014-02-22 VITALS — BP 126/61 | Ht 60.0 in | Wt 115.0 lb

## 2014-02-22 DIAGNOSIS — S42309D Unspecified fracture of shaft of humerus, unspecified arm, subsequent encounter for fracture with routine healing: Secondary | ICD-10-CM

## 2014-02-22 DIAGNOSIS — S42202D Unspecified fracture of upper end of left humerus, subsequent encounter for fracture with routine healing: Secondary | ICD-10-CM

## 2014-02-22 NOTE — Progress Notes (Signed)
Followup visit fracture  Chief Complaint  Patient presents with  . Follow-up     6 week follow up left shoulder fracture, DOI 12/20/13   BP 126/61  Ht 5' (1.524 m)  Wt 115 lb (52.164 kg)  BMI 22.46 kg/m2  The patient is improved her range of motion 120 of forward elevation active 150 passive she is pain-free to continue exercises all see her in December for final checkup  Encounter Diagnosis  Name Primary?  . Fracture of proximal end of left humerus, with routine healing, subsequent encounter Yes

## 2014-02-22 NOTE — Patient Instructions (Signed)
Continue exercises

## 2014-05-03 ENCOUNTER — Telehealth: Payer: Self-pay | Admitting: Orthopedic Surgery

## 2014-05-03 NOTE — Telephone Encounter (Signed)
Routing to Dr Harrison 

## 2014-05-03 NOTE — Telephone Encounter (Signed)
Patient is calling asking for a Rx for something to go under her breast because she has developed a rash due to her sling, please advise?

## 2014-05-04 NOTE — Telephone Encounter (Signed)
Gold bond powder

## 2014-05-04 NOTE — Telephone Encounter (Signed)
Patient aware.

## 2014-05-26 ENCOUNTER — Encounter: Payer: Self-pay | Admitting: Orthopedic Surgery

## 2014-05-26 ENCOUNTER — Ambulatory Visit (INDEPENDENT_AMBULATORY_CARE_PROVIDER_SITE_OTHER): Payer: Medicare Other | Admitting: Orthopedic Surgery

## 2014-05-26 VITALS — BP 113/57 | Ht 60.0 in | Wt 115.0 lb

## 2014-05-26 DIAGNOSIS — S42202D Unspecified fracture of upper end of left humerus, subsequent encounter for fracture with routine healing: Secondary | ICD-10-CM

## 2014-05-26 NOTE — Progress Notes (Signed)
Patient ID: Jennifer Sosa, female   DOB: 1940/07/10, 73 y.o.   MRN: [id number] Chief Complaint  Patient presents with  . Follow-up    3 month recheck left shoulder s/p humerous fx,DOI 12/20/13   BP 113/57 mmHg  Ht 5' (1.524 m)  Wt 115 lb (52.164 kg)  BMI 22.46 kg/m2  Encounter Diagnosis  Name Primary?  . Fracture of proximal end of left humerus, with routine healing, subsequent encounter Yes   History she had a proximal humerus fracture treated with closed treatment  Review of systems complains of crepitance in the left shoulder   She's doing well she has functional range of motion in the left shoulder. Vital signs are stable appearance is normal, she is oriented 3 mood and affect are normal as well. She has no palpable tenderness over the left shoulder she has 125 of flexion measurement goniometer shoulder remain stable motor function intact  Impression Encounter Diagnosis  Name Primary?  . Fracture of proximal end of left humerus, with routine healing, subsequent encounter Yes     There is no other treatment necessary. She has no pain in the shoulder at this time  Follow-up as needed

## 2014-07-22 ENCOUNTER — Encounter: Payer: Self-pay | Admitting: Gastroenterology

## 2015-01-11 ENCOUNTER — Encounter: Payer: Self-pay | Admitting: *Deleted

## 2015-02-22 ENCOUNTER — Encounter: Payer: Self-pay | Admitting: Cardiovascular Disease

## 2015-04-17 IMAGING — CR DG SHOULDER 2+V*L*
2 series · 2 of 2 positions shown · non-contrast
Comparison: None.

CLINICAL DATA: Left shoulder pain status post fall

EXAM:
LEFT SHOULDER - 2+ VIEW

[view not recorded (1 of 2)]
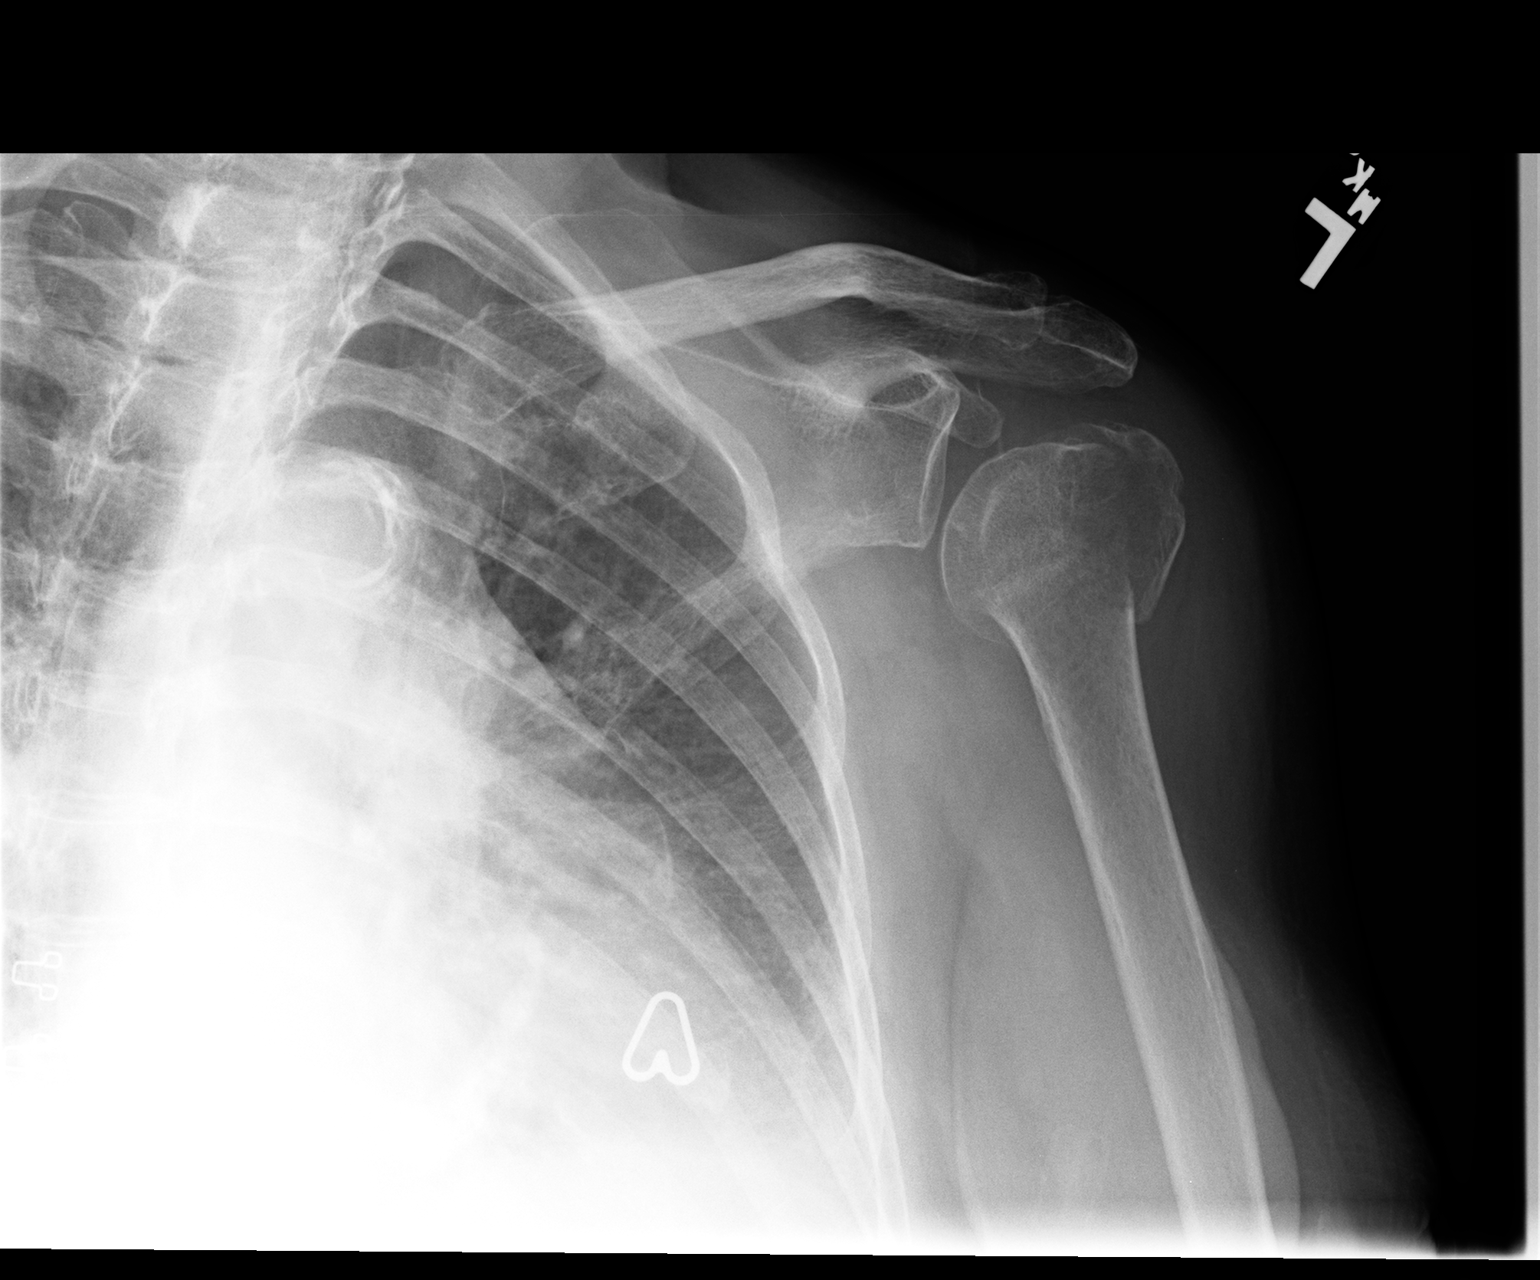

[view not recorded (2 of 2)]
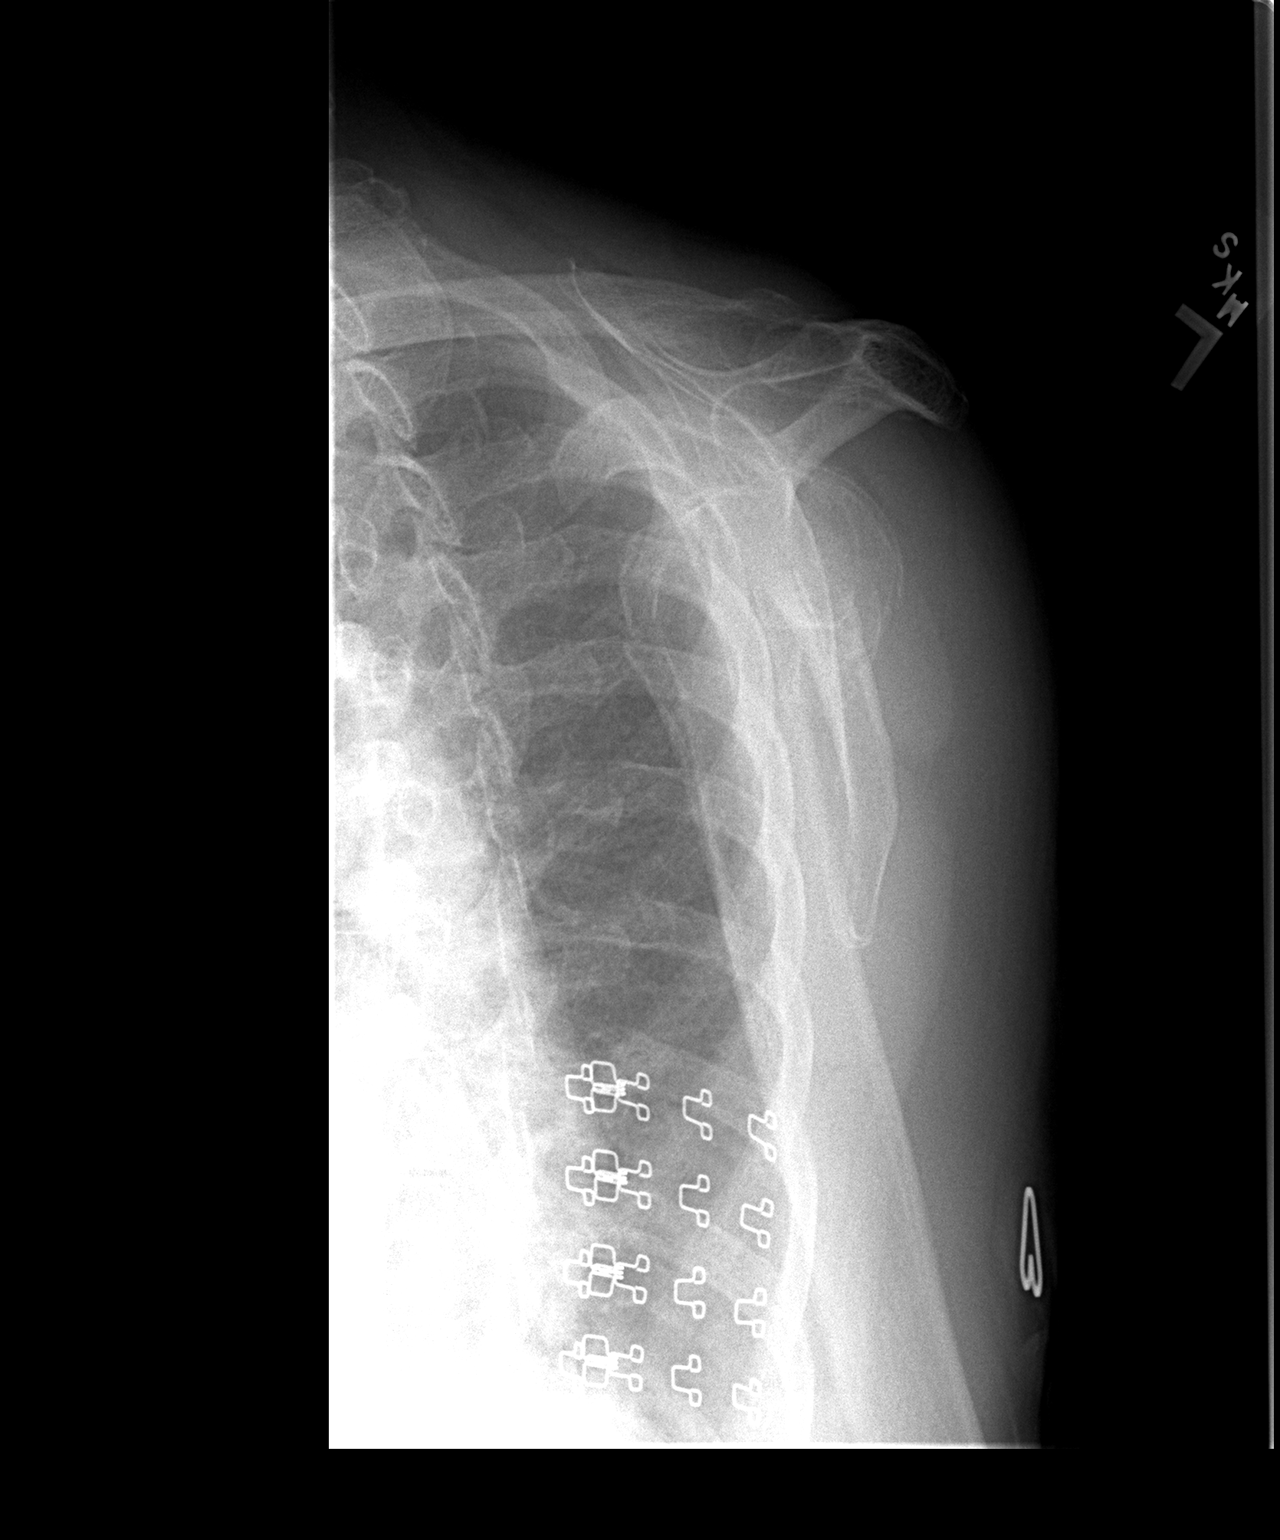

[2 of 2 positions shown; findings below may reference images not displayed]

FINDINGS: There is a mildly impacted fracture of the neck of the left humerus.
There is also an acute avulsion of the greater tuberosity of the
left humerus. The bony glenoid is intact. The acromion and distal
clavicle also are normal in appearance. The observed portions of the
upper left ribs are normal.
IMPRESSION: The patient has sustained an acute subcapital fracture of the left
humeral head as well as an avulsion of the greater tuberosity
